# Patient Record
Sex: Female | Born: 2005 | Race: White | Hispanic: Yes | Marital: Single | Smoking: Never smoker
Health system: Southern US, Community
[De-identification: ages and names within clinical notes are randomized; demographics above are authoritative.]

## PROBLEM LIST (undated history)

## (undated) DIAGNOSIS — J189 Pneumonia, unspecified organism: Secondary | ICD-10-CM

## (undated) DIAGNOSIS — J020 Streptococcal pharyngitis: Secondary | ICD-10-CM

## (undated) DIAGNOSIS — E161 Other hypoglycemia: Secondary | ICD-10-CM

## (undated) HISTORY — PX: TYMPANOSTOMY: SHX2586

---
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
## 2024-07-10 ENCOUNTER — Ambulatory Visit: Payer: Self-pay
